# Patient Record
Sex: Female | Born: 1965 | Race: Black or African American | Hispanic: No | Marital: Single | Smoking: Never smoker
Health system: Southern US, Community
[De-identification: ages and names within clinical notes are randomized; demographics above are authoritative.]

## PROBLEM LIST (undated history)

## (undated) DIAGNOSIS — R569 Unspecified convulsions: Secondary | ICD-10-CM

## (undated) DIAGNOSIS — B20 Human immunodeficiency virus [HIV] disease: Secondary | ICD-10-CM

## (undated) DIAGNOSIS — Z21 Asymptomatic human immunodeficiency virus [HIV] infection status: Secondary | ICD-10-CM

---

## 2013-04-01 DIAGNOSIS — K219 Gastro-esophageal reflux disease without esophagitis: Secondary | ICD-10-CM | POA: Insufficient documentation

## 2013-04-01 DIAGNOSIS — B20 Human immunodeficiency virus [HIV] disease: Secondary | ICD-10-CM | POA: Insufficient documentation

## 2015-09-16 ENCOUNTER — Encounter: Payer: Self-pay | Admitting: Emergency Medicine

## 2015-09-16 ENCOUNTER — Emergency Department
Admission: EM | Admit: 2015-09-16 | Discharge: 2015-09-16 | Disposition: A | Discharge status: Home / Self Care | Payer: Medicare Other | Attending: Emergency Medicine | Admitting: Emergency Medicine

## 2015-09-16 DIAGNOSIS — Z79899 Other long term (current) drug therapy: Secondary | ICD-10-CM | POA: Insufficient documentation

## 2015-09-16 DIAGNOSIS — R194 Change in bowel habit: Secondary | ICD-10-CM

## 2015-09-16 DIAGNOSIS — R152 Fecal urgency: Secondary | ICD-10-CM | POA: Insufficient documentation

## 2015-09-16 DIAGNOSIS — Z3202 Encounter for pregnancy test, result negative: Secondary | ICD-10-CM | POA: Insufficient documentation

## 2015-09-16 DIAGNOSIS — R195 Other fecal abnormalities: Secondary | ICD-10-CM | POA: Diagnosis not present

## 2015-09-16 DIAGNOSIS — R197 Diarrhea, unspecified: Secondary | ICD-10-CM | POA: Diagnosis present

## 2015-09-16 HISTORY — DX: Human immunodeficiency virus (HIV) disease: B20

## 2015-09-16 HISTORY — DX: Asymptomatic human immunodeficiency virus (hiv) infection status: Z21

## 2015-09-16 LAB — CBC WITH DIFFERENTIAL/PLATELET
BASOS ABS: 0 10*3/uL (ref 0–0.1)
Basophils Relative: 0 %
EOS ABS: 0.1 10*3/uL (ref 0–0.7)
EOS PCT: 2 %
HCT: 34.1 % — ABNORMAL LOW (ref 35.0–47.0)
Hemoglobin: 11.1 g/dL — ABNORMAL LOW (ref 12.0–16.0)
LYMPHS ABS: 1.1 10*3/uL (ref 1.0–3.6)
LYMPHS PCT: 20 %
MCH: 27 pg (ref 26.0–34.0)
MCHC: 32.7 g/dL (ref 32.0–36.0)
MCV: 82.7 fL (ref 80.0–100.0)
MONO ABS: 0.5 10*3/uL (ref 0.2–0.9)
Monocytes Relative: 10 %
Neutro Abs: 3.6 10*3/uL (ref 1.4–6.5)
Neutrophils Relative %: 68 %
PLATELETS: 325 10*3/uL (ref 150–440)
RBC: 4.12 MIL/uL (ref 3.80–5.20)
RDW: 13.8 % (ref 11.5–14.5)
WBC: 5.3 10*3/uL (ref 3.6–11.0)

## 2015-09-16 LAB — URINALYSIS COMPLETE WITH MICROSCOPIC (ARMC ONLY)
BACTERIA UA: NONE SEEN
Bilirubin Urine: NEGATIVE
Glucose, UA: NEGATIVE mg/dL
HGB URINE DIPSTICK: NEGATIVE
KETONES UR: NEGATIVE mg/dL
NITRITE: NEGATIVE
PROTEIN: 30 mg/dL — AB
SPECIFIC GRAVITY, URINE: 1.017 (ref 1.005–1.030)
pH: 7 (ref 5.0–8.0)

## 2015-09-16 LAB — BASIC METABOLIC PANEL
Anion gap: 9 (ref 5–15)
BUN: 12 mg/dL (ref 6–20)
CO2: 23 mmol/L (ref 22–32)
CREATININE: 0.88 mg/dL (ref 0.44–1.00)
Calcium: 9 mg/dL (ref 8.9–10.3)
Chloride: 107 mmol/L (ref 101–111)
GFR calc Af Amer: >60 mL/min (ref >60)
GLUCOSE: 83 mg/dL (ref 65–99)
POTASSIUM: 3.8 mmol/L (ref 3.5–5.1)
Sodium: 139 mmol/L (ref 135–145)

## 2015-09-16 LAB — POCT PREGNANCY, URINE: Preg Test, Ur: NEGATIVE

## 2015-09-16 NOTE — Discharge Instructions (Signed)
Probiotics WHAT ARE PROBIOTICS? Probiotics are the good bacteria and yeasts that live in your body and keep you and your digestive system healthy. Probiotics also help your body's defense (immune) system and protect your body against bad bacterial growth.  Certain foods contain probiotics, such as yogurt. Probiotics can also be purchased as a supplement. As with any supplement or drug, it is important to discuss its use with your health care provider.  WHAT AFFECTS THE BALANCE OF BACTERIA IN MY BODY? The balance of bacteria in your body can be affected by:   Antibiotic medicines. Antibiotics are sometimes necessary to treat infection. Unfortunately, they may kill good or friendly bacteria in your body as well as the bad bacteria. This may lead to stomach problems like diarrhea, gas, and cramping.  Disease. Some conditions are the result of an overgrowth of bad bacteria, yeasts, parasites, or fungi. These conditions include:   Infectious diarrhea.  Stomach and respiratory infections.  Skin infections.  Irritable bowel syndrome (IBS).  Inflammatory bowel diseases.  Ulcer due to Helicobacter pylori (H. pylori) infection.  Tooth decay and periodontal disease.  Vaginal infections. Stress and poor diet may also lower the good bacteria in your body.  WHAT TYPE OF PROBIOTIC IS RIGHT FOR ME? Probiotics are available over the counter at your local pharmacy, health food, or grocery store. They come in many different forms, combinations of strains, and dosing strengths. Some may need to be refrigerated. Always read the label for storage and usage instructions. Specific strains have been shown to be more effective for certain conditions. Ask your health care provider what option is best for you.  WHY WOULD I NEED PROBIOTICS? There are many reasons your health care provider might recommend a probiotic supplement, including:   Diarrhea.  Constipation.  IBS.  Respiratory infections.  Yeast  infections.  Acne, eczema, and other skin conditions.  Frequent urinary tract infections (UTIs). ARE THERE SIDE EFFECTS OF PROBIOTICS? Some people experience mild side effects when taking probiotics. Side effects are usually temporary and may include:   Gas.  Bloating.  Cramping. Rarely, serious side effects, such as infection or immune system changes, may occur. WHAT ELSE DO I NEED TO KNOW ABOUT PROBIOTICS?   There are many different strains of probiotics. Certain strains may be more effective depending on your condition. Probiotics are available in varying doses. Ask your health care provider which probiotic you should use and how often.   If you are taking probiotics along with antibiotics, it is generally recommended to wait at least 2 hours between taking the antibiotic and taking the probiotic.  FOR MORE INFORMATION:  [facility] [email or web address]   This information is not intended to replace advice given to you by your health care provider. Make sure you discuss any questions you have with your health care provider.   Document Released: 02/04/2014 Document Reviewed: 02/04/2014 Elsevier Interactive Patient Education [employer]   Your exam and labs are normal today. You should follow-up with your provider for further evaluation and possible referral to a GI specialists. You should consider dosing a OTC probiotic and a anti-diarrheal medicine, if needed.

## 2015-09-16 NOTE — ED Provider Notes (Signed)
[facility] Emergency Department Provider Note ____________________________________________  Time seen: 1130  I have reviewed the triage vital signs and the nursing notes.  HISTORY  Chief Complaint  Diarrhea  HPI Kendra Mason is a 50 y.o. female presents to the ED for evaluation of complaints with onset 4 months prior to arrival. She describes increased frequency of bowel movements over the last 4 months time. She describes she has had 3-4 bowel movements per day and at times is noted some fecal urgency without fecal incontinence. She denies frequent or copious watery stools. She describes intermittent loose stools and stool consistency ranging from loose and unformed to "normal" formed stools. She is also reporting some intermittent episodes of bright red blood on the toilet tissue with stools. She denies any rectal pain, abdominal pain, nausea, vomiting, or tarry stools. She does admit to noticing about a 10-pound weight loss over the last 4 months, but denies any intention of losing weight. She does admit to decreased appetite, and also noted that she has cut back on some meals because she typically has a bowel movement following eating. She reports her last colonoscopy was at about age 8 and reported to be normal. She also reports her last menstrual period was in December, and admits that she may be experiencing menopause. She has not notified her medical provider about her concerns are hurt increased stool frequency since his onset. When asked what brought her into the ED today after 4 months of intermittent symptoms, she replied that she can't go on having multiple bowel movements per day.  Past Medical History  Diagnosis Date  . HIV (human immunodeficiency virus infection) (HCC)     There are no active problems to display for this patient.   History reviewed. No pertinent past surgical history.  Current Outpatient Rx  Name  Route  Sig  Dispense  Refill  .  abacavir (ZIAGEN) 300 MG tablet   Oral   Take 300 mg by mouth 2 (two) times daily.         Marland Kitchen lopinavir-ritonavir (KALETRA) 200-50 MG tablet   Oral   Take 2 tablets by mouth 2 (two) times daily.         Marland Kitchen tenofovir (VIREAD) 300 MG tablet   Oral   Take 300 mg by mouth daily.          Allergies Review of patient's allergies indicates no known allergies.  History reviewed. No pertinent family history.  Social History Social History  Substance Use Topics  . Smoking status: Never Smoker   . Smokeless tobacco: None  . Alcohol Use: No   Review of Systems  Constitutional: Negative for fever. Eyes: Negative for visual changes. ENT: Negative for sore throat. Cardiovascular: Negative for chest pain. Respiratory: Negative for shortness of breath. Gastrointestinal: Negative for abdominal pain, vomiting. Reports increased bowel movements and intermittent diarrhea. Denies hematochezia, hematemesis, or pencil-thin stools.  Genitourinary: Negative for dysuria. Musculoskeletal: Negative for back pain. Skin: Negative for rash. Neurological: Negative for headaches, focal weakness or numbness. ____________________________________________  PHYSICAL EXAM:  VITAL SIGNS: ED Triage Vitals  Enc Vitals Group     BP 09/16/15 1101 109/70 mmHg     Pulse Rate 09/16/15 1101 99     Resp 09/16/15 1101 18     Temp 09/16/15 1101 97.9 F (36.6 C)     Temp src --      SpO2 09/16/15 1101 100 %     Weight 09/16/15 1101 120 lb (54.432 kg)  Height 09/16/15 1101 5' (1.524 m)     Head Cir --      Peak Flow --      Pain Score --      Pain Loc --      Pain Edu? --      Excl. in GC? --    Constitutional: Alert and oriented. Well appearing and in no distress. Head: Normocephalic and atraumatic.      Eyes: Conjunctivae are normal. PERRL. Normal extraocular movements      Ears: Canals clear. TMs intact bilaterally.   Nose: No congestion/rhinorrhea.   Mouth/Throat: Mucous membranes are  moist.   Neck: Supple. No thyromegaly. Hematological/Lymphatic/Immunological: No cervical lymphadenopathy. Cardiovascular: Normal rate, regular rhythm.  Respiratory: Normal respiratory effort. No wheezes/rales/rhonchi. Gastrointestinal: Soft and nontender. No distention, rebound, guarding, organomegaly, or rigidity. Normal bowel sounds. No CVA tenderness. Musculoskeletal: Nontender with normal range of motion in all extremities.  Neurologic:  Normal gait without ataxia. Normal speech and language. No gross focal neurologic deficits are appreciated. Skin:  Skin is warm, dry and intact. No rash noted. Psychiatric: Mood and affect are normal. Patient exhibits appropriate insight and judgment. ____________________________________________   LABS (pertinent positives/negatives) Labs Reviewed  URINALYSIS COMPLETEWITH MICROSCOPIC ([facility] ONLY) - Abnormal; Notable for the following:    Color, Urine YELLOW (*)    APPearance CLEAR (*)    Protein, ur 30 (*)    Leukocytes, UA TRACE (*)    Squamous Epithelial / LPF 0-5 (*)    All other components within normal limits  CBC WITH DIFFERENTIAL/PLATELET - Abnormal; Notable for the following:    Hemoglobin 11.1 (*)    HCT 34.1 (*)    All other components within normal limits  BASIC METABOLIC PANEL  POC URINE PREG, ED  POCT PREGNANCY, URINE  ____________________________________________  PROCEDURES  Hemoccult - Negative ____________________________________________  INITIAL IMPRESSION / ASSESSMENT AND PLAN / ED COURSE  Patient presents to the ED with a complaint of increased bowel movements over the last 3-4 months duration. She also reports some intermittent episodes of bright red blood on toilet tissue. Her exam and labs today are essentially normal, and did not give any indication of an acute GI process, colitis, diverticulitis, or signs of intractable diarrhea. She is encouraged to monitor her intake as well as document her that room habits any  diarrhea. She is also advised to consider the use of probiotics over-the-counter. She does not encourage but she may consider the use of antidiarrheal medications as her symptoms sound less like frank diarrhea and more like increased bowel movements above baseline. She will follow-up with her primary care provider and consider referral to GI specialist for further management. ____________________________________________  FINAL CLINICAL IMPRESSION(S) / ED DIAGNOSES  Final diagnoses:  Fecal urgency  Frequent bowel movements      Lissa Hoard, PA-C 09/16/15 1359  Governor Rooks, MD 09/16/15 1539

## 2015-09-16 NOTE — ED Notes (Signed)
States 4 months of diarrhea - saw pcp but did not discuss the matter with him

## 2015-09-16 NOTE — ED Notes (Signed)
States she developed diarrhea bout 2 weeks ago  But has noticed a change in her stools for the past 4 months

## 2016-02-18 ENCOUNTER — Encounter: Payer: Self-pay | Admitting: Emergency Medicine

## 2016-02-18 ENCOUNTER — Emergency Department
Admission: EM | Admit: 2016-02-18 | Discharge: 2016-02-18 | Disposition: A | Discharge status: Home / Self Care | Payer: Medicare Other | Attending: Emergency Medicine | Admitting: Emergency Medicine

## 2016-02-18 DIAGNOSIS — Y999 Unspecified external cause status: Secondary | ICD-10-CM | POA: Insufficient documentation

## 2016-02-18 DIAGNOSIS — Y9389 Activity, other specified: Secondary | ICD-10-CM | POA: Insufficient documentation

## 2016-02-18 DIAGNOSIS — M791 Myalgia: Secondary | ICD-10-CM | POA: Insufficient documentation

## 2016-02-18 DIAGNOSIS — Z79899 Other long term (current) drug therapy: Secondary | ICD-10-CM | POA: Diagnosis not present

## 2016-02-18 DIAGNOSIS — M545 Low back pain: Secondary | ICD-10-CM | POA: Diagnosis present

## 2016-02-18 DIAGNOSIS — Z21 Asymptomatic human immunodeficiency virus [HIV] infection status: Secondary | ICD-10-CM | POA: Insufficient documentation

## 2016-02-18 DIAGNOSIS — Y92410 Unspecified street and highway as the place of occurrence of the external cause: Secondary | ICD-10-CM | POA: Insufficient documentation

## 2016-02-18 DIAGNOSIS — M7918 Myalgia, other site: Secondary | ICD-10-CM

## 2016-02-18 HISTORY — DX: Unspecified convulsions: R56.9

## 2016-02-18 MED ORDER — CYCLOBENZAPRINE HCL 5 MG PO TABS: 5.0000 mg | ORAL_TABLET | Freq: Three times a day (TID) | ORAL | 0 refills | Status: AC | PRN
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
## 2016-07-03 DIAGNOSIS — L2084 Intrinsic (allergic) eczema: Secondary | ICD-10-CM | POA: Insufficient documentation

## 2020-10-19 ENCOUNTER — Other Ambulatory Visit: Payer: Self-pay | Admitting: Physician Assistant

## 2020-10-19 DIAGNOSIS — Z1231 Encounter for screening mammogram for malignant neoplasm of breast: Secondary | ICD-10-CM

## 2020-12-28 ENCOUNTER — Encounter: Payer: Self-pay | Admitting: Registered Nurse

## 2021-02-01 ENCOUNTER — Telehealth: Payer: Self-pay

## 2021-02-01 ENCOUNTER — Telehealth: Payer: Medicare Other

## 2021-02-01 NOTE — Telephone Encounter (Signed)
Patient was notified again around 10:45am with a second call. No answer

## 2021-02-01 NOTE — Telephone Encounter (Signed)
Called patient to complete CC screening. No answer/unable to leave message.

## 2021-02-22 ENCOUNTER — Other Ambulatory Visit: Payer: Self-pay

## 2021-02-22 NOTE — Progress Notes (Signed)
Updated medication list for CC screening.

## 2021-02-23 ENCOUNTER — Telehealth (INDEPENDENT_AMBULATORY_CARE_PROVIDER_SITE_OTHER): Payer: Medicare Other | Admitting: Gastroenterology

## 2021-02-23 DIAGNOSIS — Z1211 Encounter for screening for malignant neoplasm of colon: Secondary | ICD-10-CM

## 2021-02-23 MED ORDER — PEG 3350-KCL-NA BICARB-NACL 420 G PO SOLR: 4000.0000 mL | Freq: Once | ORAL | 0 refills | Status: AC

## 2021-02-23 NOTE — Progress Notes (Signed)
Gastroenterology Pre-Procedure Review  Request Date: 03/17/21 Requesting Physician: Dr. Tobi Bastos  PATIENT REVIEW QUESTIONS: The patient responded to the following health history questions as indicated:    1. Are you having any GI issues? no 2. Do you have a personal history of Polyps?  unsure 3. Do you have a family history of Colon Cancer or Polyps? no 4. Diabetes Mellitus? no 5. Joint replacements in the past 12 months?no 6. Major health problems in the past 3 months?no 7. Any artificial heart valves, MVP, or defibrillator?no    MEDICATIONS & ALLERGIES:    Patient reports the following regarding taking any anticoagulation/antiplatelet therapy:   Plavix, Coumadin, Eliquis, Xarelto, Lovenox, Pradaxa, Brilinta, or Effient? no Aspirin? no  Patient confirms/reports the following medications:  Current Outpatient Medications  Medication Sig Dispense Refill   abacavir (ZIAGEN) 300 MG tablet Take 300 mg by mouth 2 (two) times daily.     atorvastatin (LIPITOR) 20 MG tablet Take 20 mg by mouth daily.     bictegravir-emtricitabine-tenofovir AF (BIKTARVY) 50-200-25 MG TABS tablet Take 1 tablet by mouth daily.     cyclobenzaprine (FLEXERIL) 5 MG tablet Take 1 tablet (5 mg total) by mouth 3 (three) times daily as needed for muscle spasms. 30 tablet 0   EFFEXOR XR 37.5 MG 24 hr capsule Take 37.5 mg by mouth daily.     lansoprazole (PREVACID) 30 MG capsule Take 1 capsule by mouth daily.     lopinavir-ritonavir (KALETRA) 200-50 MG tablet Take 2 tablets by mouth 2 (two) times daily.     tenofovir (VIREAD) 300 MG tablet Take 300 mg by mouth daily.     No current facility-administered medications for this visit.    Patient confirms/reports the following allergies:  Allergies  Allergen Reactions   Sulfa Antibiotics Hives    No orders of the defined types were placed in this encounter.   AUTHORIZATION INFORMATION Primary Insurance: 1D#: Group #:  Secondary Insurance: 1D#: Group  #:  SCHEDULE INFORMATION: Date: 03/17/21 Time: Location: [facility]

## 2021-03-17 ENCOUNTER — Encounter: Payer: Self-pay | Admitting: Gastroenterology

## 2021-03-17 ENCOUNTER — Ambulatory Visit: Payer: Medicare Other | Admitting: Certified Registered Nurse Anesthetist

## 2021-03-17 ENCOUNTER — Encounter
Admission: RE | Disposition: A | Discharge status: Home / Self Care | Payer: Self-pay | Source: Ambulatory Visit | Attending: Gastroenterology

## 2021-03-17 ENCOUNTER — Ambulatory Visit
Admission: RE | Admit: 2021-03-17 | Discharge: 2021-03-17 | Disposition: A | Discharge status: Home / Self Care | Payer: Medicare Other | Source: Ambulatory Visit | Attending: Gastroenterology | Admitting: Gastroenterology

## 2021-03-17 DIAGNOSIS — K529 Noninfective gastroenteritis and colitis, unspecified: Secondary | ICD-10-CM | POA: Diagnosis not present

## 2021-03-17 DIAGNOSIS — Z1211 Encounter for screening for malignant neoplasm of colon: Secondary | ICD-10-CM

## 2021-03-17 DIAGNOSIS — Z79899 Other long term (current) drug therapy: Secondary | ICD-10-CM | POA: Diagnosis not present

## 2021-03-17 DIAGNOSIS — K6289 Other specified diseases of anus and rectum: Secondary | ICD-10-CM | POA: Diagnosis not present

## 2021-03-17 DIAGNOSIS — Z882 Allergy status to sulfonamides status: Secondary | ICD-10-CM | POA: Diagnosis not present

## 2021-03-17 HISTORY — PX: COLONOSCOPY WITH PROPOFOL: SHX5780

## 2021-03-17 SURGERY — COLONOSCOPY WITH PROPOFOL
Anesthesia: General

## 2021-03-17 MED ORDER — LIDOCAINE HCL (CARDIAC) PF 100 MG/5ML IV SOSY
PREFILLED_SYRINGE | INTRAVENOUS | Status: DC | PRN
  Administered 2021-03-17: 50 mg via INTRAVENOUS

## 2021-03-17 MED ORDER — PROPOFOL 500 MG/50ML IV EMUL
INTRAVENOUS | Status: DC | PRN
  Administered 2021-03-17: 150 ug/kg/min via INTRAVENOUS

## 2021-03-17 MED ORDER — PHENYLEPHRINE HCL (PRESSORS) 10 MG/ML IV SOLN
INTRAVENOUS | Status: DC | PRN
  Administered 2021-03-17: 100 ug via INTRAVENOUS
  Administered 2021-03-17: 50 ug via INTRAVENOUS

## 2021-03-17 MED ORDER — PROPOFOL 10 MG/ML IV BOLUS
INTRAVENOUS | Status: DC | PRN
  Administered 2021-03-17: 10 mg via INTRAVENOUS
  Administered 2021-03-17: 70 mg via INTRAVENOUS

## 2021-03-17 MED ORDER — SODIUM CHLORIDE 0.9 % IV SOLN
INTRAVENOUS | Status: DC
  Administered 2021-03-17: 1000 mL via INTRAVENOUS

## 2021-03-17 MED ORDER — PROPOFOL 500 MG/50ML IV EMUL
INTRAVENOUS | Status: AC
  Filled 2021-03-17: qty 50

## 2021-03-17 NOTE — Anesthesia Postprocedure Evaluation (Signed)
Anesthesia Post Note  Patient: Kendra Mason  Procedure(s) Performed: COLONOSCOPY WITH PROPOFOL  Patient location during evaluation: Endoscopy Anesthesia Type: General Level of consciousness: awake and alert Pain management: pain level controlled Vital Signs Assessment: post-procedure vital signs reviewed and stable Respiratory status: spontaneous breathing, nonlabored ventilation, respiratory function stable and patient connected to nasal cannula oxygen Cardiovascular status: blood pressure returned to baseline and stable Postop Assessment: no apparent nausea or vomiting Anesthetic complications: no   No notable events documented.   Last Vitals:  Vitals:   03/17/21 1223 03/17/21 1229  BP: 99/65 110/81  Pulse: 76 81  Resp: 17 (!) 22  Temp:    SpO2: 100% 100%    Last Pain:  Vitals:   03/17/21 1229  TempSrc:   PainSc: 0-No pain                 Lenard Simmer

## 2021-03-17 NOTE — Transfer of Care (Signed)
Immediate Anesthesia Transfer of Care Note  Patient: Everleigh Colclasure  Procedure(s) Performed: COLONOSCOPY WITH PROPOFOL  Patient Location: PACU  Anesthesia Type:General  Level of Consciousness: drowsy  Airway & Oxygen Therapy: Patient Spontanous Breathing and Patient connected to nasal cannula oxygen  Post-op Assessment: Report given to RN  Post vital signs: Reviewed and stable  Last Vitals:  Vitals Value Taken Time  BP 85/56 03/17/21 1219  Temp 35.6 C 03/17/21 1219  Pulse 76 03/17/21 1222  Resp 17 03/17/21 1222  SpO2 100 % 03/17/21 1222  Vitals shown include unvalidated device data.  Last Pain:  Vitals:   03/17/21 1219  TempSrc: Temporal  PainSc: Asleep         Complications: No notable events documented.

## 2021-03-17 NOTE — Anesthesia Preprocedure Evaluation (Signed)
Anesthesia Evaluation  Patient identified by MRN, date of birth, ID band Patient awake    Reviewed: Allergy & Precautions, H&P , NPO status , Patient's Chart, lab work & pertinent test results, reviewed documented beta blocker date and time   History of Anesthesia Complications Negative for: history of anesthetic complications  Airway Mallampati: III  TM Distance: >3 FB Neck ROM: full    Dental  (+) Dental Advidsory Given, Missing, Chipped, Teeth Intact   Pulmonary neg pulmonary ROS,    Pulmonary exam normal breath sounds clear to auscultation       Cardiovascular Exercise Tolerance: Good hypertension, (-) angina(-) CAD, (-) Past MI and (-) Cardiac Stents Normal cardiovascular exam(-) dysrhythmias (-) Valvular Problems/Murmurs Rhythm:regular Rate:Normal     Neuro/Psych Seizures -,  TIAnegative psych ROS   GI/Hepatic Neg liver ROS, GERD  ,  Endo/Other  negative endocrine ROS  Renal/GU negative Renal ROS  negative genitourinary   Musculoskeletal   Abdominal   Peds  Hematology  (+) HIV,   Anesthesia Other Findings Past Medical History: No date: HIV (human immunodeficiency virus infection) (HCC) No date: Seizures (HCC)   Reproductive/Obstetrics negative OB ROS                             Anesthesia Physical Anesthesia Plan  ASA: 3  Anesthesia Plan: General   Post-op Pain Management:    Induction: Intravenous  PONV Risk Score and Plan: 3 and TIVA and Propofol infusion  Airway Management Planned: Natural Airway and Nasal Cannula  Additional Equipment:   Intra-op Plan:   Post-operative Plan:   Informed Consent: I have reviewed the patients History and Physical, chart, labs and discussed the procedure including the risks, benefits and alternatives for the proposed anesthesia with the patient or authorized representative who has indicated his/her understanding and acceptance.      Dental Advisory Given  Plan Discussed with: Anesthesiologist, CRNA and Surgeon  Anesthesia Plan Comments:         Anesthesia Quick Evaluation

## 2021-03-17 NOTE — H&P (Signed)
Wyline Mood, MD [address], [place], [place], [address] [address], [address], [place], [place], [address] Phone: [phone]  Fax: [phone]  Primary Care Physician:  Santiago Bur, [place]   Pre-Procedure History & Physical: HPI:  Kendra Mason is a 55 y.o. female is here for an colonoscopy.   Past Medical History:  Diagnosis Date   HIV (human immunodeficiency virus infection) (HCC)    Seizures (HCC)     History reviewed. No pertinent surgical history.  Prior to Admission medications   Medication Sig Start Date End Date Taking? Authorizing Provider  abacavir (ZIAGEN) 300 MG tablet Take 300 mg by mouth 2 (two) times daily.   Yes [provider]  amLODipine (NORVASC) 10 MG tablet Take 10 mg by mouth daily. 02/18/21  Yes [provider]  atorvastatin (LIPITOR) 20 MG tablet Take 20 mg by mouth daily. 02/18/21  Yes [provider]  bictegravir-emtricitabine-tenofovir AF (BIKTARVY) 50-200-25 MG TABS tablet Take 1 tablet by mouth daily. 08/29/19  Yes [provider]  cyclobenzaprine (FLEXERIL) 5 MG tablet Take 1 tablet (5 mg total) by mouth 3 (three) times daily as needed for muscle spasms. 02/18/16   Triplett, Cari B, FNP  EFFEXOR XR 37.5 MG 24 hr capsule Take 37.5 mg by mouth daily. Patient not taking: Reported on 03/17/2021 11/23/20   [provider]  lansoprazole (PREVACID) 30 MG capsule Take 1 capsule by mouth daily. Patient not taking: Reported on 03/17/2021 10/29/19   [provider]  lopinavir-ritonavir (KALETRA) 200-50 MG tablet Take 2 tablets by mouth 2 (two) times daily. Patient not taking: Reported on 03/17/2021    [provider]  tenofovir (VIREAD) 300 MG tablet Take 300 mg by mouth daily. Patient not taking: Reported on 03/17/2021    [provider]    Allergies as of 02/23/2021 - Review Complete 02/23/2021  Allergen Reaction Noted   Sulfa antibiotics Hives 11/22/2017    History  reviewed. No pertinent family history.  Social History   Socioeconomic History   Marital status: Single    Spouse name: Not on file   Number of children: Not on file   Years of education: Not on file   Highest education level: Not on file  Occupational History   Not on file  Tobacco Use   Smoking status: Never   Smokeless tobacco: Never  Vaping Use   Vaping Use: Never used  Substance and Sexual Activity   Alcohol use: No   Drug use: Not Currently   Sexual activity: Not on file  Other Topics Concern   Not on file  Social History Narrative   Not on file   Social Determinants of Health   Financial Resource Strain: Not on file  Food Insecurity: Not on file  Transportation Needs: Not on file  Physical Activity: Not on file  Stress: Not on file  Social Connections: Not on file  Intimate Partner Violence: Not on file    Review of Systems: See HPI, otherwise negative ROS  Physical Exam: BP (!) 149/100   Pulse 70   Temp (!) 96.8 F (36 C) (Temporal)   Resp 18   Ht 5' (1.524 m)   Wt 75 kg   SpO2 100%   BMI 32.29 kg/m  General:   Alert,  pleasant and cooperative in NAD Head:  Normocephalic and atraumatic. Neck:  Supple; no masses or thyromegaly. Lungs:  Clear throughout to auscultation, normal respiratory effort.    Heart:  +S1, +S2,  Regular rate and rhythm, No edema. Abdomen:  Soft, nontender and nondistended. Normal bowel sounds, without guarding, and without rebound.   Neurologic:  Alert and  oriented x4;  grossly normal neurologically.  Impression/Plan: Kendra Mason is here for an colonoscopy to be performed for Screening colonoscopy average risk   Risks, benefits, limitations, and alternatives regarding  colonoscopy have been reviewed with the patient.  Questions have been answered.  All parties agreeable.   Wyline Mood, MD  03/17/2021, 11:40 AM

## 2021-03-17 NOTE — Op Note (Signed)
[facility] Patient Name: Kendra Mason Procedure Date: 03/17/2021 11:44 AM MRN: [id number] Account #: [id number] Date of Birth: 1966/04/27 Admit Type: Outpatient Age: 55 Room: [facility] ENDO ROOM 3 Gender: Female Note Status: Finalized Procedure:             Colonoscopy Indications:           Screening for colorectal malignant neoplasm Providers:             Wyline Mood MD, MD Referring MD:          Marzella Schlein. Bacigalupo (Referring MD) Medicines:             Monitored Anesthesia Care Complications:         No immediate complications. Procedure:             Pre-Anesthesia Assessment:                        - Prior to the procedure, a History and Physical was                         performed, and patient medications, allergies and                         sensitivities were reviewed. The patient's tolerance                         of previous anesthesia was reviewed.                        - The risks and benefits of the procedure and the                         sedation options and risks were discussed with the                         patient. All questions were answered and informed                         consent was obtained.                        - ASA Grade Assessment: II - A patient with mild                         systemic disease.                        After obtaining informed consent, the colonoscope was                         passed under direct vision. Throughout the procedure,                         the patient's blood pressure, pulse, and oxygen                         saturations were monitored continuously. The                         Colonoscope was introduced through the anus and  advanced to the the cecum, identified by the                         appendiceal orifice. The colonoscopy was performed                         with ease. The patient tolerated the procedure well.                         The  quality of the bowel preparation was adequate. Findings:      The perianal and digital rectal examinations were normal.      Localized mild inflammation characterized by congestion (edema) and       erythema was found in the rectum and at the appendiceal orifice.       Biopsies were taken with a cold forceps for histology.      The exam was otherwise without abnormality. Impression:            - Localized mild inflammation was found in the rectum                         and at the appendiceal orifice secondary to colitis.                         Biopsied.                        - The examination was otherwise normal. Recommendation:        - Discharge patient to home (with escort).                        - Resume previous diet.                        - Continue present medications.                        - Await pathology results.                        - Repeat colonoscopy for surveillance based on                         pathology results. Procedure Code(s):     --- Professional ---                        [phone], Colonoscopy, flexible; with biopsy, single or                         multiple Diagnosis Code(s):     --- Professional ---                        Z12.11, Encounter for screening for malignant neoplasm                         of colon                        K52.9, Noninfective gastroenteritis and colitis,  unspecified CPT copyright 2019 American Medical Association. All rights reserved. The codes documented in this report are preliminary and upon coder review may  be revised to meet current compliance requirements. Wyline Mood, MD Wyline Mood MD, MD 03/17/2021 12:17:56 PM This report has been signed electronically. Number of Addenda: 0 Note Initiated On: 03/17/2021 11:44 AM Scope Withdrawal Time: 0 hours 13 minutes 22 seconds  Total Procedure Duration: 0 hours 16 minutes 36 seconds  Estimated Blood Loss:  Estimated blood loss: none.      [facility]

## 2021-03-18 ENCOUNTER — Encounter: Payer: Self-pay | Admitting: Gastroenterology

## 2021-03-24 ENCOUNTER — Telehealth: Payer: Self-pay

## 2021-03-24 NOTE — Telephone Encounter (Signed)
-----   Message from Wyline Mood, MD sent at 03/23/2021  9:02 AM EDT ----- Inform biopsies from recent colonoscopy show active inflammation , I would need to do an office visit to discuss this further to determine if we need to do anythiung more?

## 2021-03-24 NOTE — Telephone Encounter (Signed)
Called patient but could not leave a voicemail. I will tey to call later on.

## 2021-03-25 LAB — SURGICAL PATHOLOGY

## 2021-04-06 NOTE — Telephone Encounter (Signed)
Called patient again and it only rang and I am not able to leave her a voicemail. I will send here a letter to call our office for an appointment.

## 2021-04-06 NOTE — Telephone Encounter (Signed)
-----   Message from Wyline Mood, MD sent at 04/05/2021 11:20 AM EDT ----- Kandis Cocking were you able to reach her ?

## 2021-04-14 ENCOUNTER — Telehealth: Payer: Self-pay | Admitting: Gastroenterology

## 2021-04-14 NOTE — Telephone Encounter (Signed)
Pt. Calling for results of procedure

## 2021-04-15 NOTE — Telephone Encounter (Signed)
Called patient back and I was not able to leave her a voicemail. I have tried reaching patient several times and it has been hard. I will have to send her another message letting her know that she needs to call our office to schedule a follow up appointment with Dr. Tobi Bastos.

## 2021-04-21 ENCOUNTER — Other Ambulatory Visit: Payer: Self-pay

## 2021-04-21 ENCOUNTER — Encounter: Payer: Self-pay | Admitting: Gastroenterology

## 2021-04-21 ENCOUNTER — Ambulatory Visit (INDEPENDENT_AMBULATORY_CARE_PROVIDER_SITE_OTHER): Payer: Medicare Other | Admitting: Gastroenterology

## 2021-04-21 VITALS — BP 109/73 | HR 84 | Temp 98.3°F | Wt 155.0 lb

## 2021-04-21 DIAGNOSIS — K529 Noninfective gastroenteritis and colitis, unspecified: Secondary | ICD-10-CM

## 2021-04-21 NOTE — Progress Notes (Signed)
Wyline Mood MD, MRCP(U.K) [address]  [address]  [place], [place] [address]  Main: [phone]  Fax: [phone]   Gastroenterology Consultation  Referring Provider:     Santiago Bur, [place] Primary Care Physician:  Santiago Bur, [place] Primary Gastroenterologist:  Dr. Wyline Mood  Reason for Consultation:     Discuss results of her biopsies        HPI:   Kendra Mason is a 55 y.o. y/o female recently underwent a colonoscopy for colon cancer screening and had Localized mild inflammation characterized by congestion (edema) and erythema was found in the rectum and at the appendiceal orifice. Biopsies were taken with a cold forceps for histology. Pathology showed moderate active colitis and proctitis with no chronic changes. Viral stains were negative.   She denies any GI symptoms.  Denies any diarrhea vomiting abdominal pain rectal bleeding.  She cannot recall taking any NSAIDs prior to the colonoscopy.  Past Medical History:  Diagnosis Date   HIV (human immunodeficiency virus infection) (HCC)    Seizures (HCC)     Past Surgical History:  Procedure Laterality Date   COLONOSCOPY WITH PROPOFOL N/A 03/17/2021   Procedure: COLONOSCOPY WITH PROPOFOL;  Surgeon: Wyline Mood, MD;  Location[facility] ENDOSCOPY;  Service: Gastroenterology;  Laterality: N/A;    Prior to Admission medications   Medication Sig Start Date End Date Taking? Authorizing Provider  abacavir (ZIAGEN) 300 MG tablet Take 300 mg by mouth 2 (two) times daily.    [provider]  amLODipine (NORVASC) 10 MG tablet Take 10 mg by mouth daily. 02/18/21   [provider]  atorvastatin (LIPITOR) 20 MG tablet Take 20 mg by mouth daily. 02/18/21   [provider]  bictegravir-emtricitabine-tenofovir AF (BIKTARVY) 50-200-25 MG TABS tablet Take 1 tablet by mouth daily. 08/29/19   [provider]  cyclobenzaprine (FLEXERIL) 5 MG tablet Take 1 tablet (5 mg total) by mouth 3 (three) times daily  as needed for muscle spasms. 02/18/16   Triplett, Cari B, FNP  EFFEXOR XR 37.5 MG 24 hr capsule Take 37.5 mg by mouth daily. Patient not taking: Reported on 03/17/2021 11/23/20   [provider]  lansoprazole (PREVACID) 30 MG capsule Take 1 capsule by mouth daily. Patient not taking: Reported on 03/17/2021 10/29/19   [provider]  lopinavir-ritonavir (KALETRA) 200-50 MG tablet Take 2 tablets by mouth 2 (two) times daily. Patient not taking: Reported on 03/17/2021    [provider]  tenofovir (VIREAD) 300 MG tablet Take 300 mg by mouth daily. Patient not taking: Reported on 03/17/2021    [provider]    No family history on file.   Social History   Tobacco Use   Smoking status: Never   Smokeless tobacco: Never  Vaping Use   Vaping Use: Never used  Substance Use Topics   Alcohol use: No   Drug use: Not Currently    Allergies as of 04/21/2021 - Review Complete 03/17/2021  Allergen Reaction Noted   Sulfa antibiotics Hives 11/22/2017    Review of Systems:    All systems reviewed and negative except where noted in HPI.   Physical Exam:  There were no vitals taken for this visit. No LMP recorded. (Menstrual status: Perimenopausal). Psych:  Alert and cooperative. Normal mood and affect. General:   Alert,  Well-developed, well-nourished, pleasant and cooperative in NAD Head:  Normocephalic and atraumatic. Eyes:  Sclera clear, no icterus.   Conjunctiva pink. Ears:  Normal auditory acuity.  Neurologic:  Alert and oriented x3;  grossly normal neurologically.Marland Kitchen Psych:  Alert and cooperative. Normal mood and affect.  Imaging Studies: No results found.  Assessment and Plan:   Kendra Mason is a 55 y.o. y/o female here to discuss results of her recent colitis where colitis and procitis were seen and path report suggested it to be acute with no chronic changes .  Features of acute colitis can occur due to an infectious cause of versus an effect due to  NSAIDs, occasionally could also be the initial findings of the commencement of inflammatory bowel disease.  I explained to her clearly that if she has no symptoms that is nothing more we need to do but if she were to get lower GI symptoms such as diarrhea abdominal pain cramping spasms to contact me and then we would need to evaluate her for IBD at that point of time likely with a repeat colonoscopy.  Follow up in as needed  Dr Wyline Mood MD,MRCP(U.K)

## 2021-12-07 ENCOUNTER — Emergency Department: Payer: Medicare Other

## 2021-12-07 ENCOUNTER — Other Ambulatory Visit: Payer: Self-pay

## 2021-12-07 ENCOUNTER — Emergency Department
Admission: EM | Admit: 2021-12-07 | Discharge: 2021-12-07 | Disposition: A | Discharge status: Home / Self Care | Payer: Medicare Other | Attending: Emergency Medicine | Admitting: Emergency Medicine

## 2021-12-07 DIAGNOSIS — K529 Noninfective gastroenteritis and colitis, unspecified: Secondary | ICD-10-CM | POA: Diagnosis not present

## 2021-12-07 DIAGNOSIS — Z21 Asymptomatic human immunodeficiency virus [HIV] infection status: Secondary | ICD-10-CM | POA: Insufficient documentation

## 2021-12-07 DIAGNOSIS — R1032 Left lower quadrant pain: Secondary | ICD-10-CM

## 2021-12-07 DIAGNOSIS — N390 Urinary tract infection, site not specified: Secondary | ICD-10-CM | POA: Diagnosis not present

## 2021-12-07 DIAGNOSIS — R944 Abnormal results of kidney function studies: Secondary | ICD-10-CM | POA: Diagnosis not present

## 2021-12-07 LAB — URINALYSIS, ROUTINE W REFLEX MICROSCOPIC
Bilirubin Urine: NEGATIVE
Glucose, UA: NEGATIVE mg/dL
Ketones, ur: NEGATIVE mg/dL
Nitrite: POSITIVE — AB
Protein, ur: NEGATIVE mg/dL
Specific Gravity, Urine: 1.016 (ref 1.005–1.030)
pH: 6 (ref 5.0–8.0)

## 2021-12-07 LAB — COMPREHENSIVE METABOLIC PANEL
ALT: 14 U/L (ref 0–44)
AST: 17 U/L (ref 15–41)
Albumin: 4.2 g/dL (ref 3.5–5.0)
Alkaline Phosphatase: 109 U/L (ref 38–126)
Anion gap: 6 (ref 5–15)
BUN: 17 mg/dL (ref 6–20)
CO2: 23 mmol/L (ref 22–32)
Calcium: 9.3 mg/dL (ref 8.9–10.3)
Chloride: 108 mmol/L (ref 98–111)
Creatinine, Ser: 1.08 mg/dL — ABNORMAL HIGH (ref 0.44–1.00)
GFR, Estimated: >60 mL/min (ref >60)
Glucose, Bld: 87 mg/dL (ref 70–99)
Potassium: 3.8 mmol/L (ref 3.5–5.1)
Sodium: 137 mmol/L (ref 135–145)
Total Bilirubin: 0.7 mg/dL (ref 0.3–1.2)
Total Protein: 7.9 g/dL (ref 6.5–8.1)

## 2021-12-07 LAB — CBC
HCT: 41.2 % (ref 36.0–46.0)
Hemoglobin: 13.5 g/dL (ref 12.0–15.0)
MCH: 30.5 pg (ref 26.0–34.0)
MCHC: 32.8 g/dL (ref 30.0–36.0)
MCV: 93 fL (ref 80.0–100.0)
Platelets: 254 10*3/uL (ref 150–400)
RBC: 4.43 MIL/uL (ref 3.87–5.11)
RDW: 12.4 % (ref 11.5–15.5)
WBC: 8.9 10*3/uL (ref 4.0–10.5)
nRBC: 0 % (ref 0.0–0.2)

## 2021-12-07 LAB — LIPASE, BLOOD: Lipase: 43 U/L (ref 11–51)

## 2021-12-07 MED ORDER — IOHEXOL 300 MG/ML  SOLN
100.0000 mL | Freq: Once | INTRAMUSCULAR | Status: AC | PRN
  Administered 2021-12-07: 100 mL via INTRAVENOUS

## 2021-12-07 MED ORDER — CEPHALEXIN 500 MG PO CAPS: 500.0000 mg | ORAL_CAPSULE | Freq: Two times a day (BID) | ORAL | 0 refills | Status: AC

## 2021-12-07 NOTE — Discharge Instructions (Addendum)
We are starting you on some antibiotics for a UTI.  See your CT scan as below.  However given the abnormal CT imaging you need to call GI to make a follow-up appointment to discuss further given this was Dr. Tobi Bastos plan if you develop abdominal cramping again return to the ER for fevers worsening pain or any other concerns ? ?IMPRESSION:  ?1. There is mild circumferential wall thickening of the LEFT colon  ?and rectum with prominent adjacent perirectal lymph nodes. Findings  ?could reflect colitis in the appropriate clinical setting. Recommend  ?correlation with colonoscopy screening history.  ?2. Suggestive bladder wall thickening of the decompressed bladder.  ?Findings could reflect cystitis in the appropriate clinical setting.  ?Recommend correlation with urine analysis.  ?3. Asymmetric cortical scarring of the LEFT kidney.  ?4. There is a 5 mm hypodense mass within the pancreatic body/tail.  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 Differential considerations including side branch IPMN.  ?Recommend further dedicated characterization with non emergent MRI  ?abdomen with and without contrast.  ?   ? ?

## 2021-12-07 NOTE — ED Provider Notes (Addendum)
? ?[facility] ?Provider Note ? ? ? Event Date/Time  ? First MD Initiated Contact with Patient 12/07/21 1354   ?  (approximate) ? ? ?History  ? ?Abdominal Pain ? ? ?HPI ? ?Kendra Mason is a 56 y.o. female who comes in with abdominal pain in the left lower quadrant for the past 3 days.  Denies any other associated symptoms.  She denies any dysuria, vaginal bleeding.  She reports the pain is intermittent.  She does report history of HIV but she is been compliant with her medication and reports being undetectable.  She denies any chest pain, shortness of breath. ?Declines preg test given LMP 3 years ago.l ? ?Physical Exam  ? ?Triage Vital Signs: ?ED Triage Vitals  ?Enc Vitals Group  ?   BP 12/07/21 1202 117/83  ?   Pulse Rate 12/07/21 1202 96  ?   Resp 12/07/21 1202 18  ?   Temp 12/07/21 1202 98.6 ?F (37 ?C)  ?   Temp Source 12/07/21 1202 Oral  ?   SpO2 12/07/21 1202 100 %  ?   Weight 12/07/21 1202 150 lb (68 kg)  ?   Height 12/07/21 1407 5' (1.524 m)  ?   Head Circumference --   ?   Peak Flow --   ?   Pain Score 12/07/21 1202 0  ?   Pain Loc --   ?   Pain Edu? --   ?   Excl. in GC? --   ? ? ?Most recent vital signs: ?Vitals:  ? 12/07/21 1202 12/07/21 1408  ?BP: 117/83 120/80  ?Pulse: 96 88  ?Resp: 18 18  ?Temp: 98.6 ?F (37 ?C)   ?SpO2: 100% 99%  ? ? ? ?General: Awake, no distress.  ?CV:  Good peripheral perfusion.  ?Resp:  Normal effort.  ?Abd:  No distention.  Slight tenderness left lower quadrant ?Other:   ? ? ?ED Results / Procedures / Treatments  ? ?Labs ?(all labs ordered are listed, but only abnormal results are displayed) ?Labs Reviewed  ?COMPREHENSIVE METABOLIC PANEL - Abnormal; Notable for the following components:  ?    Result Value  ? Creatinine, Ser 1.08 (*)   ? All other components within normal limits  ?URINALYSIS, ROUTINE W REFLEX MICROSCOPIC - Abnormal; Notable for the following components:  ? Color, Urine YELLOW (*)   ? APPearance HAZY (*)   ? Hgb urine dipstick SMALL (*)   ?  Nitrite POSITIVE (*)   ? Leukocytes,Ua LARGE (*)   ? Bacteria, UA MANY (*)   ? All other components within normal limits  ?LIPASE, BLOOD  ?CBC  ?POC URINE PREG, ED  ? ? ? ? ?RADIOLOGY ?Pending ? ?PROCEDURES: ? ?Critical Care performed: No ? ?Procedures ? ? ?MEDICATIONS ORDERED IN ED: ?Medications - No data to display ? ? ?IMPRESSION / MDM / ASSESSMENT AND PLAN / ED COURSE  ?I reviewed the triage vital signs and the nursing notes. ? ?Patient comes in with left lower quadrant pain concerning for acute life-threatening pathology such as differential: AAA rupture, obstruction, ovarian torsion, appendicitis, diverticulitis, kidney stone.  We will proceed with CT imaging ? ?UA concerning for UTI will send for culture.  Lipase normal CMP slightly elevated creatinine.  CBC normal ? ?Patient will be handed off to oncoming team pending CT imaging and suspect discharge home if reassuring with antibiotics for her UTI ? ?CT scan is concerning for colitis.  I reviewed patient's note from September 2022 where she had  similar concerns for colitis and the plan was to only follow-up if she developed any abdominal spasms or cramping and given this we will have her follow back up with Dr. Gwenlyn Saran has no diarrhea to suggest an infectious diarrhea at this time.  No blood in stool.  No pain out of proportion to suggest ischemic colitis.  In the meantime we will start on some antibiotics for UTI recommended Tylenol for pain and patient instructed to call GI for follow-up.  She expressed understanding and felt comfortable with this plan ? ?Patient provided a copy of results of the incidental finding and discussed for nonemergent follow-up ? ? ? ? ?FINAL CLINICAL IMPRESSION(S) / ED DIAGNOSES  ? ?Final diagnoses:  ?Lower urinary tract infectious disease  ?Left lower quadrant abdominal pain  ?Colitis  ? ? ? ?Rx / DC Orders  ? ?ED Discharge Orders   ? ?      Ordered  ?  cephALEXin (KEFLEX) 500 MG capsule  2 times daily       ? 12/07/21 1537   ? ?  ?  ? ?  ? ? ? ?Note:  This document was prepared using Dragon voice recognition software and may include unintentional dictation errors. ?  ?Concha Se, MD ?12/07/21 1439 ? ?  ?Concha Se, MD ?12/07/21 1538 ? ?  ?Concha Se, MD ?12/07/21 1539 ? ?

## 2021-12-07 NOTE — ED Triage Notes (Signed)
Pt comes pov with left sided lower pain for 3 days. Denies any n/v/d.  ?

## 2021-12-07 NOTE — ED Notes (Signed)
See triage note  presents wit pain to left lateral abd  states pain started 3 days ago  denies any fever or n/v/d    states she has not taken anything for pain ?

## 2021-12-08 LAB — URINE CULTURE: Culture: NO GROWTH

## 2022-08-16 IMAGING — CT CT ABD-PELV W/ CM
2 of 5 series · 15 of 46 positions shown, 17 images · IV contrast (agent unspecified)
Comparison: None Available.

CLINICAL DATA: Abdominal pain, post-op

EXAM:
CT ABDOMEN AND PELVIS WITH CONTRAST
TECHNIQUE: Multidetector CT imaging of the abdomen and pelvis was performed
using the standard protocol following bolus administration of
intravenous contrast.

[Series 2: abdomen 5.0 · axial · 0.74mm/px · z∈[+938,+1328]mm · 12 of 92 slices shown, 14 images]
[im 7/92  soft-tissue]
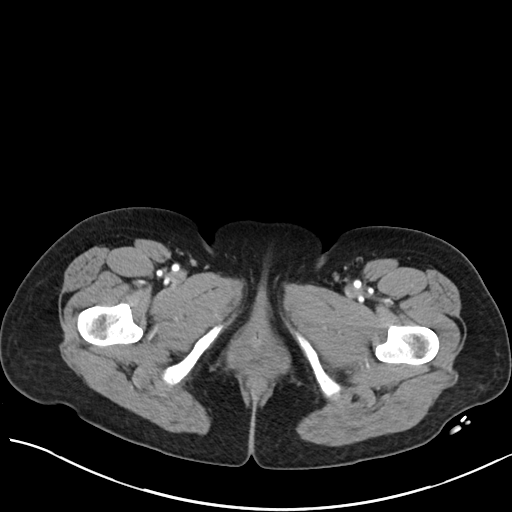
[im 7/92  bone]
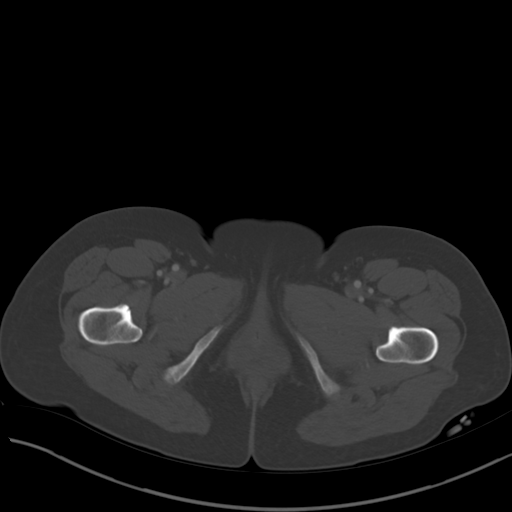
[im 14/92  soft-tissue]
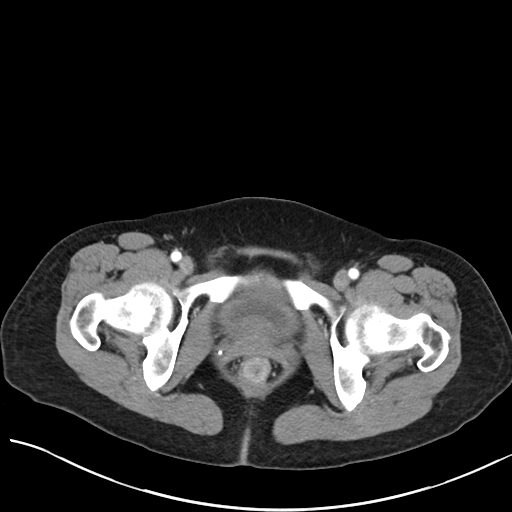
[im 20/92  soft-tissue]
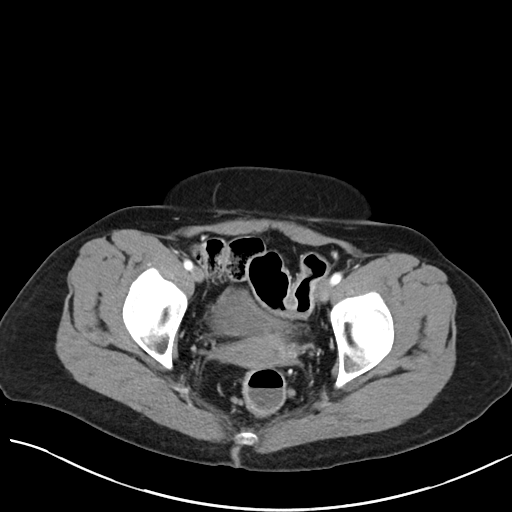
[im 27/92  soft-tissue]
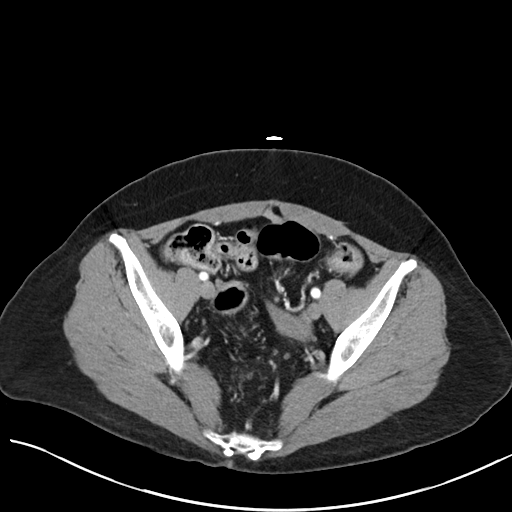
[im 33/92  soft-tissue]
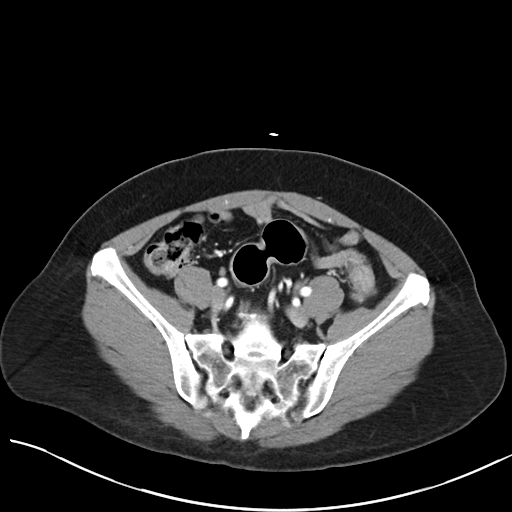
[im 40/92  soft-tissue]
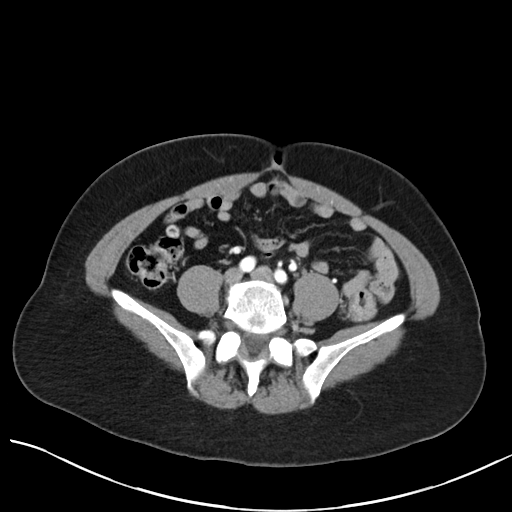
[im 53/92  soft-tissue]
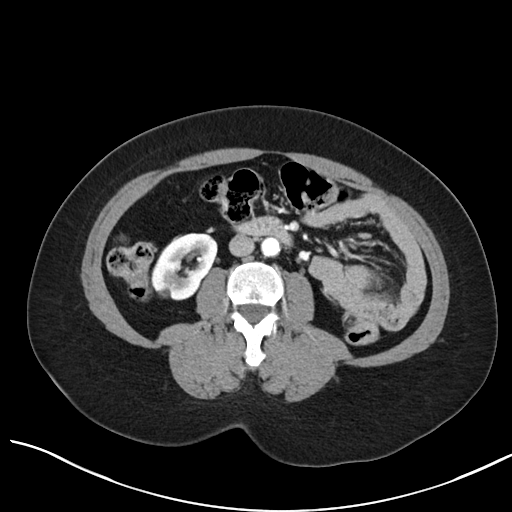
[im 59/92  soft-tissue]
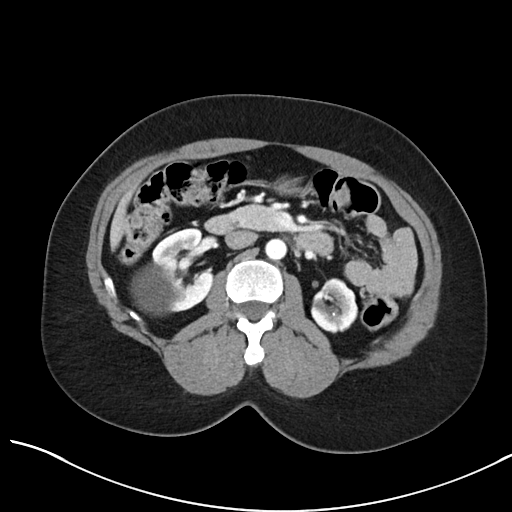
[im 66/92  soft-tissue]
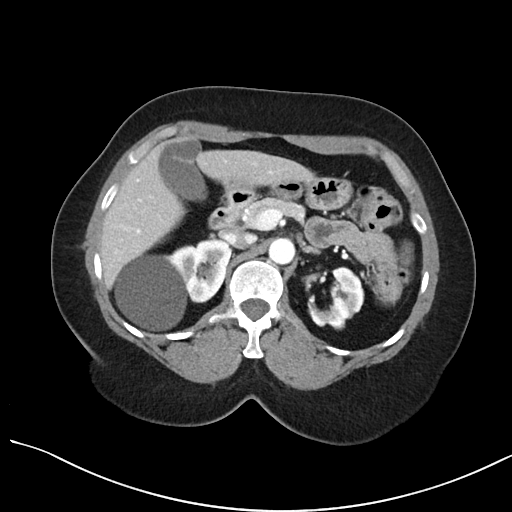
[im 66/92  bone]
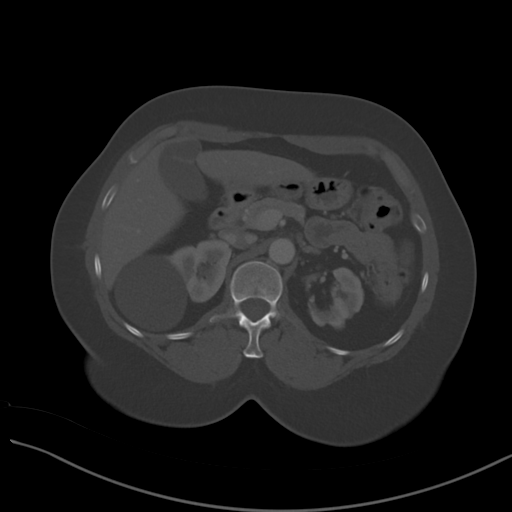
[im 72/92  soft-tissue]
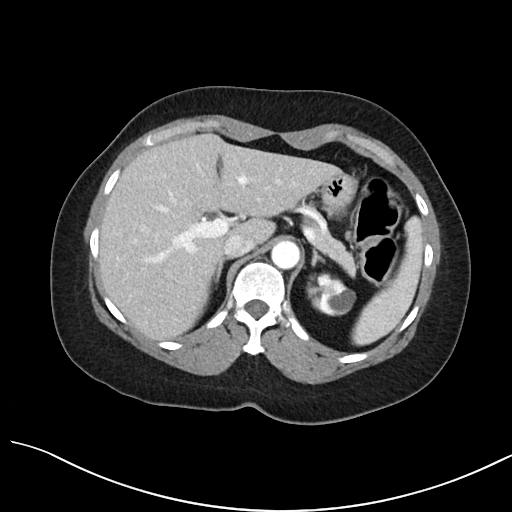
[im 79/92  soft-tissue]
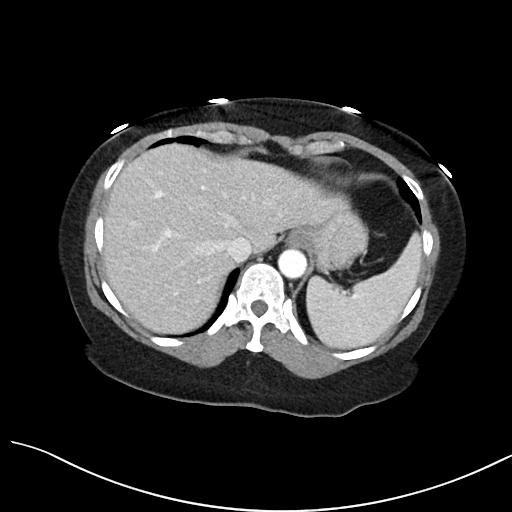
[im 85/92  soft-tissue]
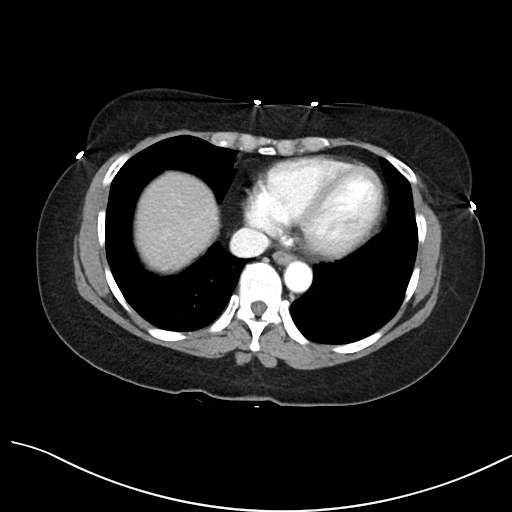

[Series 5: abdomen 3.0 mpr cor · coronal · 0.77mm/px · 3 of 99 slices shown]
[im 33/99  soft-tissue]
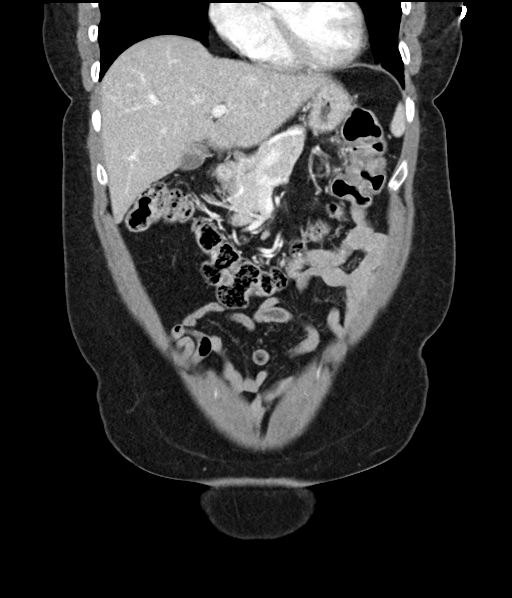
[im 44/99  soft-tissue]
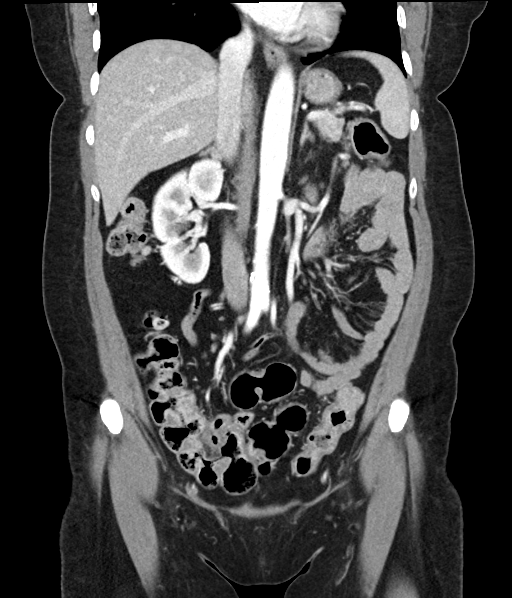
[im 55/99  soft-tissue]
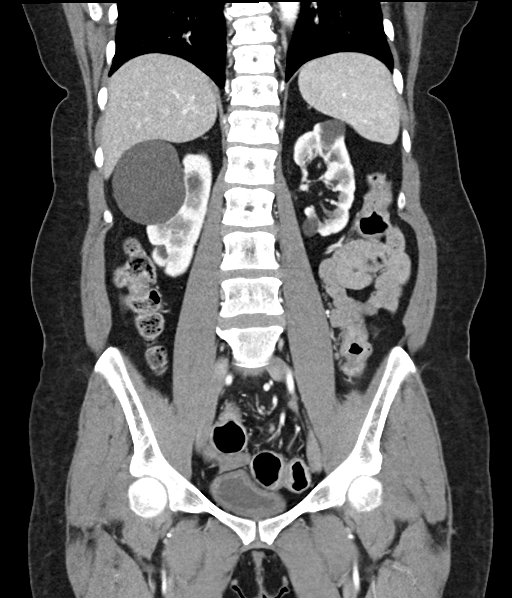

[15 of 46 positions shown; findings below may reference images not displayed]

RADIATION DOSE REDUCTION: This exam was performed according to the
departmental dose-optimization program which includes automated
exposure control, adjustment of the mA and/or kV according to
patient size and/or use of iterative reconstruction technique.

CONTRAST:  100mL OMNIPAQUE IOHEXOL 300 MG/ML  SOLN
FINDINGS: Lower chest: No acute abnormality.

Hepatobiliary: Hepatic steatosis. Gallbladder is unremarkable. No
intrahepatic or extrahepatic biliary ductal dilation.

Pancreas: There is a 5 mm hypodense mass within the pancreatic
body/tail (series 2, image 23). On some images, it has a near fat
density but not definitive.

Spleen: Normal in size without focal abnormality.

Adrenals/Urinary Tract: Adrenal glands are unremarkable. Atrophic
LEFT kidney cortical scarring. There is symmetric renal enhancement.
Bilateral renal cysts (for which no dedicated imaging follow-up is
recommended). Additional subcentimeter hypodense masses are too
small to accurately characterize. Punctate nonobstructing
nephrolithiasis bilaterally. Bladder is decompressed with
subjectively circumferential thickened walls

Stomach/Bowel: No evidence of bowel obstruction. There is mild
circumferential bowel wall thickening throughout predominantly the
LEFT hemicolon and rectum. Appendix is normal. Stomach is
decompressed.

Vascular/Lymphatic: Mild atherosclerotic calcifications of the
aorta. There are multiple prominent perirectal lymph nodes.
Representative perirectal lymph node measures 6 mm in the short axis
(series 2, image 67).

Reproductive: Uterus and bilateral adnexa are unremarkable.

Other: Trace pelvic free fluid.

Musculoskeletal: No acute or significant osseous findings.
IMPRESSION: 1. There is mild circumferential wall thickening of the LEFT colon
and rectum with prominent adjacent perirectal lymph nodes. Findings
could reflect colitis in the appropriate clinical setting. Recommend
correlation with colonoscopy screening history.
2. Suggestive bladder wall thickening of the decompressed bladder.
Findings could reflect cystitis in the appropriate clinical setting.
Recommend correlation with urine analysis.
3. Asymmetric cortical scarring of the LEFT kidney.
4. There is a 5 mm hypodense mass within the pancreatic body/tail.
This is favored to reflect an intrapancreatic lipoma but is not
definitive. Differential considerations including side branch IPMN.
Recommend further dedicated characterization with non emergent MRI
abdomen with and without contrast.

## 2022-08-29 ENCOUNTER — Other Ambulatory Visit: Payer: Self-pay | Admitting: Nurse Practitioner

## 2022-08-29 DIAGNOSIS — Z1231 Encounter for screening mammogram for malignant neoplasm of breast: Secondary | ICD-10-CM

## 2023-08-20 ENCOUNTER — Other Ambulatory Visit: Payer: Self-pay | Admitting: Nurse Practitioner

## 2023-08-20 DIAGNOSIS — Z1231 Encounter for screening mammogram for malignant neoplasm of breast: Secondary | ICD-10-CM

## 2024-03-05 ENCOUNTER — Other Ambulatory Visit: Payer: Self-pay | Admitting: Nurse Practitioner

## 2024-03-05 DIAGNOSIS — Z1231 Encounter for screening mammogram for malignant neoplasm of breast: Secondary | ICD-10-CM
